# Patient Record
Sex: Male | Born: 1965 | Race: White | Hispanic: No | Smoking: Current every day smoker
Health system: Southern US, Community
[De-identification: ages and names within clinical notes are randomized; demographics above are authoritative.]

## PROBLEM LIST (undated history)

## (undated) DIAGNOSIS — IMO0002 Reserved for concepts with insufficient information to code with codable children: Secondary | ICD-10-CM

## (undated) DIAGNOSIS — J40 Bronchitis, not specified as acute or chronic: Secondary | ICD-10-CM

## (undated) DIAGNOSIS — M549 Dorsalgia, unspecified: Secondary | ICD-10-CM

---

## 2003-01-14 ENCOUNTER — Emergency Department (HOSPITAL_COMMUNITY): Admission: EM | Admit: 2003-01-14 | Discharge: 2003-01-14 | Payer: Self-pay | Admitting: Emergency Medicine

## 2003-01-14 ENCOUNTER — Encounter: Payer: Self-pay | Admitting: Emergency Medicine

## 2004-08-24 ENCOUNTER — Emergency Department (HOSPITAL_COMMUNITY): Admission: EM | Admit: 2004-08-24 | Discharge: 2004-08-24 | Payer: Self-pay | Admitting: *Deleted

## 2005-02-12 ENCOUNTER — Emergency Department (HOSPITAL_COMMUNITY): Admission: EM | Admit: 2005-02-12 | Discharge: 2005-02-12 | Payer: Self-pay | Admitting: Emergency Medicine

## 2005-03-24 ENCOUNTER — Emergency Department (HOSPITAL_COMMUNITY): Admission: EM | Admit: 2005-03-24 | Discharge: 2005-03-24 | Payer: Self-pay | Admitting: *Deleted

## 2006-01-02 ENCOUNTER — Emergency Department (HOSPITAL_COMMUNITY): Admission: EM | Admit: 2006-01-02 | Discharge: 2006-01-02 | Payer: Self-pay | Admitting: Emergency Medicine

## 2006-11-19 ENCOUNTER — Emergency Department (HOSPITAL_COMMUNITY): Admission: EM | Admit: 2006-11-19 | Discharge: 2006-11-19 | Payer: Self-pay | Admitting: Emergency Medicine

## 2007-10-26 ENCOUNTER — Emergency Department (HOSPITAL_COMMUNITY): Admission: EM | Admit: 2007-10-26 | Discharge: 2007-10-26 | Payer: Self-pay | Admitting: Emergency Medicine

## 2007-11-29 ENCOUNTER — Emergency Department (HOSPITAL_COMMUNITY): Admission: EM | Admit: 2007-11-29 | Discharge: 2007-11-29 | Payer: Self-pay | Admitting: Emergency Medicine

## 2008-05-22 ENCOUNTER — Emergency Department (HOSPITAL_COMMUNITY): Admission: EM | Admit: 2008-05-22 | Discharge: 2008-05-22 | Payer: Self-pay | Admitting: Emergency Medicine

## 2008-06-21 ENCOUNTER — Emergency Department (HOSPITAL_COMMUNITY): Admission: EM | Admit: 2008-06-21 | Discharge: 2008-06-21 | Payer: Self-pay | Admitting: Emergency Medicine

## 2008-07-04 ENCOUNTER — Emergency Department (HOSPITAL_COMMUNITY): Admission: EM | Admit: 2008-07-04 | Discharge: 2008-07-04 | Payer: Self-pay | Admitting: Emergency Medicine

## 2008-07-26 ENCOUNTER — Emergency Department (HOSPITAL_COMMUNITY): Admission: EM | Admit: 2008-07-26 | Discharge: 2008-07-26 | Payer: Self-pay | Admitting: Emergency Medicine

## 2008-08-20 ENCOUNTER — Emergency Department (HOSPITAL_COMMUNITY): Admission: EM | Admit: 2008-08-20 | Discharge: 2008-08-20 | Payer: Self-pay | Admitting: Emergency Medicine

## 2008-09-16 ENCOUNTER — Emergency Department (HOSPITAL_COMMUNITY): Admission: EM | Admit: 2008-09-16 | Discharge: 2008-09-16 | Payer: Self-pay | Admitting: Emergency Medicine

## 2008-10-21 ENCOUNTER — Encounter
Admission: RE | Admit: 2008-10-21 | Discharge: 2008-10-21 | Payer: Self-pay | Admitting: Physical Medicine & Rehabilitation

## 2009-04-08 ENCOUNTER — Emergency Department (HOSPITAL_COMMUNITY): Admission: EM | Admit: 2009-04-08 | Discharge: 2009-04-08 | Payer: Self-pay | Admitting: Emergency Medicine

## 2009-10-25 ENCOUNTER — Emergency Department: Payer: Self-pay | Admitting: Emergency Medicine

## 2009-11-05 ENCOUNTER — Emergency Department (HOSPITAL_COMMUNITY): Admission: EM | Admit: 2009-11-05 | Discharge: 2009-11-06 | Payer: Self-pay | Admitting: Emergency Medicine

## 2009-11-05 ENCOUNTER — Emergency Department: Payer: Self-pay | Admitting: Internal Medicine

## 2010-01-29 ENCOUNTER — Emergency Department (HOSPITAL_COMMUNITY): Admission: EM | Admit: 2010-01-29 | Discharge: 2010-01-29 | Payer: Self-pay | Admitting: Emergency Medicine

## 2010-01-30 ENCOUNTER — Emergency Department: Payer: Self-pay | Admitting: Emergency Medicine

## 2010-01-30 ENCOUNTER — Emergency Department: Payer: Self-pay | Admitting: Internal Medicine

## 2010-03-10 ENCOUNTER — Emergency Department: Payer: Self-pay | Admitting: Emergency Medicine

## 2010-03-15 ENCOUNTER — Emergency Department (HOSPITAL_COMMUNITY): Admission: EM | Admit: 2010-03-15 | Discharge: 2010-03-16 | Payer: Self-pay | Admitting: Emergency Medicine

## 2010-07-01 ENCOUNTER — Emergency Department (HOSPITAL_BASED_OUTPATIENT_CLINIC_OR_DEPARTMENT_OTHER)
Admission: EM | Admit: 2010-07-01 | Discharge: 2010-07-01 | Payer: Self-pay | Source: Home / Self Care | Admitting: Emergency Medicine

## 2010-09-05 ENCOUNTER — Emergency Department (HOSPITAL_COMMUNITY)
Admission: EM | Admit: 2010-09-05 | Discharge: 2010-09-06 | Disposition: A | Discharge status: Home / Self Care | Payer: Self-pay | Attending: Emergency Medicine | Admitting: Emergency Medicine

## 2010-09-05 DIAGNOSIS — K089 Disorder of teeth and supporting structures, unspecified: Secondary | ICD-10-CM | POA: Insufficient documentation

## 2010-09-05 DIAGNOSIS — K029 Dental caries, unspecified: Secondary | ICD-10-CM | POA: Insufficient documentation

## 2012-12-10 ENCOUNTER — Emergency Department (HOSPITAL_COMMUNITY): Payer: BC Managed Care – PPO

## 2012-12-10 ENCOUNTER — Emergency Department (HOSPITAL_COMMUNITY)
Admission: EM | Admit: 2012-12-10 | Discharge: 2012-12-10 | Disposition: A | Discharge status: Home / Self Care | Payer: BC Managed Care – PPO | Attending: Emergency Medicine | Admitting: Emergency Medicine

## 2012-12-10 ENCOUNTER — Encounter (HOSPITAL_COMMUNITY): Payer: Self-pay | Admitting: *Deleted

## 2012-12-10 DIAGNOSIS — Z79899 Other long term (current) drug therapy: Secondary | ICD-10-CM | POA: Insufficient documentation

## 2012-12-10 DIAGNOSIS — J45901 Unspecified asthma with (acute) exacerbation: Secondary | ICD-10-CM | POA: Insufficient documentation

## 2012-12-10 DIAGNOSIS — R059 Cough, unspecified: Secondary | ICD-10-CM | POA: Insufficient documentation

## 2012-12-10 DIAGNOSIS — F17200 Nicotine dependence, unspecified, uncomplicated: Secondary | ICD-10-CM | POA: Insufficient documentation

## 2012-12-10 DIAGNOSIS — J40 Bronchitis, not specified as acute or chronic: Secondary | ICD-10-CM

## 2012-12-10 DIAGNOSIS — J45909 Unspecified asthma, uncomplicated: Secondary | ICD-10-CM

## 2012-12-10 DIAGNOSIS — IMO0002 Reserved for concepts with insufficient information to code with codable children: Secondary | ICD-10-CM | POA: Insufficient documentation

## 2012-12-10 DIAGNOSIS — R05 Cough: Secondary | ICD-10-CM | POA: Insufficient documentation

## 2012-12-10 HISTORY — DX: Bronchitis, not specified as acute or chronic: J40

## 2012-12-10 MED ORDER — ALBUTEROL (5 MG/ML) CONTINUOUS INHALATION SOLN
10.0000 mg/h | INHALATION_SOLUTION | RESPIRATORY_TRACT | Status: DC
  Administered 2012-12-10: 10 mg/h via RESPIRATORY_TRACT

## 2012-12-10 MED ORDER — IPRATROPIUM BROMIDE 0.02 % IN SOLN
0.5000 mg | Freq: Once | RESPIRATORY_TRACT | Status: AC
  Administered 2012-12-10: 0.5 mg via RESPIRATORY_TRACT
  Filled 2012-12-10: qty 2.5

## 2012-12-10 MED ORDER — PREDNISONE 10 MG PO TABS: 60.0000 mg | ORAL_TABLET | Freq: Every day | ORAL | Status: DC

## 2012-12-10 MED ORDER — ALBUTEROL SULFATE (5 MG/ML) 0.5% IN NEBU
5.0000 mg | INHALATION_SOLUTION | Freq: Once | RESPIRATORY_TRACT | Status: AC
  Administered 2012-12-10: 5 mg via RESPIRATORY_TRACT
  Filled 2012-12-10: qty 0.5

## 2012-12-10 MED ORDER — ALBUTEROL SULFATE (5 MG/ML) 0.5% IN NEBU: 10.0000 mg | INHALATION_SOLUTION | Freq: Once | RESPIRATORY_TRACT | Status: DC

## 2012-12-10 MED ORDER — ALBUTEROL SULFATE HFA 108 (90 BASE) MCG/ACT IN AERS
2.0000 | INHALATION_SPRAY | RESPIRATORY_TRACT | Status: DC
  Administered 2012-12-10: 2 via RESPIRATORY_TRACT
  Filled 2012-12-10: qty 6.7

## 2012-12-10 MED ORDER — PREDNISONE 20 MG PO TABS
60.0000 mg | ORAL_TABLET | Freq: Once | ORAL | Status: AC
  Administered 2012-12-10: 60 mg via ORAL
  Filled 2012-12-10: qty 3

## 2012-12-10 MED ORDER — ALBUTEROL (5 MG/ML) CONTINUOUS INHALATION SOLN
INHALATION_SOLUTION | RESPIRATORY_TRACT | Status: AC
  Filled 2012-12-10: qty 20

## 2012-12-10 NOTE — ED Provider Notes (Signed)
History     CSN: [id number]  Arrival date & time 12/10/12  0059   First MD Initiated Contact with Patient 12/10/12 0123      Chief Complaint  Patient presents with  . Shortness of Breath    HPI Patient reports worsening coughing congestion as well as shortness breath for about 2 weeks.  He has tried albuterol at home without improvement in his symptoms.  He has no diagnosis of asthma or COPD/emphysema.  He reports he had similar symptoms about a year ago and was prescribed steroids albuterol that time with significant improvement in his symptoms.  He continues to smoke cigarettes.  His shins that he should stop.  No unilateral leg swelling.  No history of DVT or pulmonary embolism.  No fevers or chills.  He reports his cough is severe.  No chest pain.  His symptoms are mild to moderate in severity.    Past Medical History  Diagnosis Date  . Bronchitis     History reviewed. No pertinent past surgical history.  No family history on file.  History  Substance Use Topics  . Smoking status: Current Every Day Smoker  . Smokeless tobacco: Not on file  . Alcohol Use: Yes      Review of Systems  All other systems reviewed and are negative.    Allergies  Review of patient's allergies indicates no known allergies.  Home Medications   Current Outpatient Rx  Name  Route  Sig  Dispense  Refill  . albuterol (PROVENTIL HFA;VENTOLIN HFA) 108 (90 BASE) MCG/ACT inhaler   Inhalation   Inhale 2 puffs into the lungs every 6 (six) hours as needed for wheezing (SOB).         Marland Kitchen amoxicillin-clavulanate (AUGMENTIN) 875-125 MG per tablet   Oral   Take 1 tablet by mouth 2 (two) times daily. Pt took old  Pills  From last year         . predniSONE (DELTASONE) 10 MG tablet   Oral   Take 6 tablets (60 mg total) by mouth daily.   30 tablet   0     BP 138/84  Pulse 80  Temp(Src) 98.7 F (37.1 C) (Oral)  Resp 20  SpO2 97%  Physical Exam  Nursing note and vitals  reviewed. Constitutional: He is oriented to person, place, and time. He appears well-developed and well-nourished.  HENT:  Head: Normocephalic and atraumatic.  Eyes: EOM are normal.  Neck: Normal range of motion.  Cardiovascular: Normal rate, regular rhythm, normal heart sounds and intact distal pulses.   Pulmonary/Chest: Effort normal. No respiratory distress. He has wheezes.  Abdominal: Soft. He exhibits no distension. There is no tenderness.  Genitourinary: Rectum normal.  Musculoskeletal: Normal range of motion.  Neurological: He is alert and oriented to person, place, and time.  Skin: Skin is warm and dry.  Psychiatric: He has a normal mood and affect. Judgment normal.    ED Course  Procedures (including critical care time)  Labs Reviewed - No data to display Dg Chest 2 View  12/10/2012   *RADIOLOGY REPORT*  Clinical Data: Coughing short of breath  CHEST - 2 VIEW  Comparison: 04/08/2009  Findings: Normal mediastinum and cardiac silhouette.  Normal pulmonary  vasculature.  No evidence of effusion, infiltrate, or pneumothorax.  No acute bony abnormality.  IMPRESSION: Normal chest radiograph.   Original Report Authenticated By: Genevive Bi, M.D.   I personally reviewed the imaging tests through PACS system I reviewed available ER/hospitalization records through  the EMR   1. Bronchitis   2. Reactive airway disease       MDM  4:29 AM  patient feels much better at this time.  This is some type of reactive airway disease.  I've strongly encouraged patient to stop smoking cigarettes.  He will need to follow up with his primary care physician.  Home with albuterol and prednisone.  He understands to return to the ER for new or worsening symptoms.       3  Lyanne Co, MD 12/10/12 [phone]

## 2012-12-10 NOTE — ED Notes (Signed)
Chest congestion, cough and shob for about 2 weeks worse recently

## 2012-12-10 NOTE — ED Notes (Signed)
RT notified of need for extended nebulizer

## 2012-12-17 ENCOUNTER — Other Ambulatory Visit: Payer: Self-pay | Admitting: Occupational Medicine

## 2012-12-17 ENCOUNTER — Ambulatory Visit: Payer: Self-pay

## 2012-12-17 DIAGNOSIS — R52 Pain, unspecified: Secondary | ICD-10-CM

## 2013-02-01 ENCOUNTER — Emergency Department (HOSPITAL_COMMUNITY)
Admission: EM | Admit: 2013-02-01 | Discharge: 2013-02-02 | Disposition: A | Discharge status: Home / Self Care | Payer: Worker's Compensation | Attending: Emergency Medicine | Admitting: Emergency Medicine

## 2013-02-01 ENCOUNTER — Encounter (HOSPITAL_COMMUNITY): Payer: Self-pay | Admitting: Emergency Medicine

## 2013-02-01 DIAGNOSIS — Z8709 Personal history of other diseases of the respiratory system: Secondary | ICD-10-CM | POA: Insufficient documentation

## 2013-02-01 DIAGNOSIS — W010XXA Fall on same level from slipping, tripping and stumbling without subsequent striking against object, initial encounter: Secondary | ICD-10-CM | POA: Insufficient documentation

## 2013-02-01 DIAGNOSIS — F17200 Nicotine dependence, unspecified, uncomplicated: Secondary | ICD-10-CM | POA: Insufficient documentation

## 2013-02-01 DIAGNOSIS — Y9269 Other specified industrial and construction area as the place of occurrence of the external cause: Secondary | ICD-10-CM | POA: Insufficient documentation

## 2013-02-01 DIAGNOSIS — Z8739 Personal history of other diseases of the musculoskeletal system and connective tissue: Secondary | ICD-10-CM | POA: Insufficient documentation

## 2013-02-01 DIAGNOSIS — Z79899 Other long term (current) drug therapy: Secondary | ICD-10-CM | POA: Insufficient documentation

## 2013-02-01 DIAGNOSIS — S93409A Sprain of unspecified ligament of unspecified ankle, initial encounter: Secondary | ICD-10-CM | POA: Insufficient documentation

## 2013-02-01 DIAGNOSIS — Y9301 Activity, walking, marching and hiking: Secondary | ICD-10-CM | POA: Insufficient documentation

## 2013-02-01 DIAGNOSIS — Y990 Civilian activity done for income or pay: Secondary | ICD-10-CM | POA: Insufficient documentation

## 2013-02-01 HISTORY — DX: Dorsalgia, unspecified: M54.9

## 2013-02-01 NOTE — ED Notes (Signed)
C/o R ankle pain.  States he twisted R ankle while at work just pta. CMS intact.

## 2013-02-02 ENCOUNTER — Emergency Department (HOSPITAL_COMMUNITY): Payer: Worker's Compensation

## 2013-02-02 MED ORDER — HYDROCODONE-ACETAMINOPHEN 5-325 MG PO TABS
1.0000 | ORAL_TABLET | Freq: Once | ORAL | Status: AC
  Administered 2013-02-02: 1 via ORAL
  Filled 2013-02-02: qty 1

## 2013-02-02 MED ORDER — NAPROXEN 500 MG PO TABS: 500.0000 mg | ORAL_TABLET | Freq: Two times a day (BID) | ORAL | Status: DC

## 2013-02-02 NOTE — ED Provider Notes (Signed)
   History    CSN: [id number] Arrival date & time 02/01/13  2251  First MD Initiated Contact with Patient 02/01/13 2306     Chief Complaint  Patient presents with  . Ankle Pain   HPI  History provided by the patient. Patient is a 47 year old male who presents with complaints of right ankle injury and pain. 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 This caused him to stumble and twisted his right ankle. Since that time he has had pain to his ankle. Patient was wearing high ankle boot. He denies any other injuries. No head injury or LOC. He is not use any treatment for symptoms. Pain is worse with walking or pressure. No other aggravating or alleviating factors. No other associated symptoms.   Past Medical History  Diagnosis Date  . Bronchitis   . Back pain    History reviewed. No pertinent past surgical history. No family history on file. History  Substance Use Topics  . Smoking status: Current Every Day Smoker  . Smokeless tobacco: Not on file  . Alcohol Use: Yes    Review of Systems  Neurological: Negative for weakness and numbness.  All other systems reviewed and are negative.    Allergies  Review of patient's allergies indicates no known allergies.  Home Medications   Current Outpatient Rx  Name  Route  Sig  Dispense  Refill  . albuterol (PROVENTIL HFA;VENTOLIN HFA) 108 (90 BASE) MCG/ACT inhaler   Inhalation   Inhale 2 puffs into the lungs every 6 (six) hours as needed for wheezing (SOB).          BP 145/97  Pulse 95  Temp(Src) 97.9 F (36.6 C) (Oral)  Resp 16  SpO2 96% Physical Exam  Nursing note and vitals reviewed. Constitutional: He is oriented to person, place, and time. He appears well-developed and well-nourished.  HENT:  Head: Normocephalic.  Cardiovascular: Normal rate and regular rhythm.   Pulmonary/Chest: Effort normal and  breath sounds normal. No respiratory distress. He has no wheezes. He has no rales.  Musculoskeletal: He exhibits tenderness.  Moderate tenderness over the right lateral ankle. There is no significant swelling. No deformity. Skin normal in color. Normal dorsal pedal pulses, sensation in toes and Refill less than 2 seconds.  Neurological: He is alert and oriented to person, place, and time.  Skin: Skin is warm.  Psychiatric: He has a normal mood and affect. His behavior is normal.    ED Course  Procedures    Dg Ankle Complete Right  02/02/2013   *RADIOLOGY REPORT*  Clinical Data: Lateral ankle pain  RIGHT ANKLE - COMPLETE 3+ VIEW  Comparison: None.  Findings: Mortise is symmetric. No fracture or dislocation.  No soft tissue abnormality.  No radiopaque foreign body.  IMPRESSION: No acute osseous abnormality.   Original Report Authenticated By: Christiana Pellant, M.D.     1. Ankle sprain and strain, right, initial encounter     MDM  Patient seen and evaluated. Patient appears uncomfortable but in no acute distress.   X-rays unremarkable. At this time suspect minor sprain. Patient advised to use RICE treatment   Angus Seller, PA-C 02/02/13 0209

## 2013-02-02 NOTE — ED Provider Notes (Signed)
Medical screening examination/treatment/procedure(s) were performed by non-physician practitioner and as supervising physician I was immediately available for consultation/collaboration.   Dione Booze, MD 02/02/13 [phone]

## 2013-02-02 NOTE — ED Notes (Signed)
Paged ortho to apply aso ankle and provide crutches.

## 2013-02-02 NOTE — ED Notes (Signed)
Workman's Comp drug screen obtained by lab.

## 2013-05-22 ENCOUNTER — Emergency Department (HOSPITAL_COMMUNITY)
Admission: EM | Admit: 2013-05-22 | Discharge: 2013-05-22 | Disposition: A | Discharge status: Home / Self Care | Payer: BC Managed Care – PPO | Source: Home / Self Care | Attending: Family Medicine | Admitting: Family Medicine

## 2013-05-22 ENCOUNTER — Encounter (HOSPITAL_COMMUNITY): Payer: Self-pay | Admitting: Emergency Medicine

## 2013-05-22 DIAGNOSIS — M545 Low back pain, unspecified: Secondary | ICD-10-CM

## 2013-05-22 DIAGNOSIS — G8929 Other chronic pain: Secondary | ICD-10-CM

## 2013-05-22 NOTE — ED Provider Notes (Signed)
Medical screening examination/treatment/procedure(s) were performed by a resident physician and as supervising physician I was immediately available for consultation/collaboration.  Leslee Home, M.D.  Reuben Likes, MD 05/22/13 [phone]

## 2013-05-22 NOTE — ED Provider Notes (Signed)
CSN: [id number]     Arrival date & time 05/22/13  1650 History   First MD Initiated Contact with Patient 05/22/13 1827     Chief Complaint  Patient presents with  . Back Pain   (Consider location/radiation/quality/duration/timing/severity/associated sxs/prior Treatment) HPI Patient is a 47 yo M presenting for evaluation of chronic back pain. He had injury to back 12/11/12 while pulling heavy hose at work. He had a burning sensation down his back at that time. He has been diagnosed with DJD seen on MRI and X-rays. He states his Worker's Comp is not covering anything now. He is followed by Dr. Yevette Edwards and has been referred to another doctor and he is waiting on that appointment (?pain management). Patient states doctor is not giving him "anything strong", he is prescribed Norco once daily which he states does not help so he takes it more. He also is on a muscle relaxer. Recently prescribed Meloxicam and Tylenol #3.   When asked what he would like to accomplish tonight, he states he feels like he should be out of work. He states he is "fed up" with everything and is not going to get better if he does not rest. It was explained to him that at an [facility], we do acute care and not worker's comp evaluation. This should be done by a specialist. Also, we do not prescribe chronic medications such as pain pills. Any time off or medications may affect his case, but he states he does not care.  Past Medical History  Diagnosis Date  . Bronchitis   . Back pain    History reviewed. No pertinent past surgical history. History reviewed. No pertinent family history. History  Substance Use Topics  . Smoking status: Current Every Day Smoker  . Smokeless tobacco: Not on file  . Alcohol Use: Yes    Review of Systems  Constitutional: Negative for fever and chills.  HENT: Negative for congestion.   Eyes: Negative for visual disturbance.  Respiratory: Negative for cough and shortness of breath.    Cardiovascular: Negative for chest pain and leg swelling.  Gastrointestinal: Negative for abdominal pain.  Genitourinary: Negative for dysuria.  Musculoskeletal: Positive for arthralgias, back pain, gait problem and myalgias. Negative for neck pain.  Skin: Negative for rash.  Neurological: Negative for headaches.    Allergies  Review of patient's allergies indicates no known allergies.  Home Medications   Current Outpatient Rx  Name  Route  Sig  Dispense  Refill  . albuterol (PROVENTIL HFA;VENTOLIN HFA) 108 (90 BASE) MCG/ACT inhaler   Inhalation   Inhale 2 puffs into the lungs every 6 (six) hours as needed for wheezing (SOB).         . naproxen (NAPROSYN) 500 MG tablet   Oral   Take 1 tablet (500 mg total) by mouth 2 (two) times daily.   30 tablet   0    BP 142/94  Pulse 87  Temp(Src) 97.3 F (36.3 C) (Oral)  Resp 16  SpO2 99% Physical Exam  Constitutional: He is oriented to person, place, and time. He appears well-developed and well-nourished.  Seems upset  HENT:  Head: Normocephalic and atraumatic.  Neck: Normal range of motion. Neck supple.  Cardiovascular: Normal rate, regular rhythm and normal heart sounds.   Pulmonary/Chest: Effort normal and breath sounds normal. He has no wheezes.  Abdominal: Soft. There is no tenderness.  Musculoskeletal: He exhibits no edema and no tenderness.  Changes positions frequently in chair. Good ROM of  back, has unstrapped back brace on. No TTP of spinal processes. Neg straight leg raise on my exam. Able to ambulate.  Neurological: He is alert and oriented to person, place, and time.  Skin: Skin is warm and dry.  Psychiatric: He has a normal mood and affect.    ED Course  Procedures (including critical care time) Labs Review Labs Reviewed - No data to display Imaging Review No results found.  MDM   1. Chronic lower back pain    Explained to patient that these cases should be seen by specialist, especially since it is a  potential worker's comp.  No medications prescribed. Given work note for 11/6-11/7, and explained to patient that taking these days may interfere with his case. Follow up with ortho tomorrow, at least by phone.     Hilarie Fredrickson, MD 05/22/13 1900

## 2013-05-22 NOTE — ED Notes (Signed)
States he had injured his back 12-11-2012 [employer], and reportedly has had his workman's comp case denied. States his leg gave out at work today , and had not been able to get in touch w his orthopaedist about his situation today

## 2013-06-17 ENCOUNTER — Emergency Department (INDEPENDENT_AMBULATORY_CARE_PROVIDER_SITE_OTHER)
Admission: EM | Admit: 2013-06-17 | Discharge: 2013-06-17 | Disposition: A | Discharge status: Home / Self Care | Payer: BC Managed Care – PPO | Source: Home / Self Care | Attending: Family Medicine | Admitting: Family Medicine

## 2013-06-17 ENCOUNTER — Encounter (HOSPITAL_COMMUNITY): Payer: Self-pay | Admitting: Emergency Medicine

## 2013-06-17 DIAGNOSIS — M549 Dorsalgia, unspecified: Secondary | ICD-10-CM

## 2013-06-17 DIAGNOSIS — G8929 Other chronic pain: Secondary | ICD-10-CM

## 2013-06-17 NOTE — ED Provider Notes (Signed)
CSN: [id number]     Arrival date & time 06/17/13  1737 History   First MD Initiated Contact with Patient 06/17/13 1859     Chief Complaint  Patient presents with  . Back Pain   (Consider location/radiation/quality/duration/timing/severity/associated sxs/prior Treatment) Patient is a 47 y.o. male presenting with back pain. The history is provided by the patient.  Back Pain Location:  Lumbar spine Quality:  Stabbing and shooting Radiates to:  Does not radiate Pain severity:  Moderate Chronicity:  Chronic Context comment:  Chronic back pain made worse today by physical therapy today, according to patient, here for work note.and pain meds.   Past Medical History  Diagnosis Date  . Bronchitis   . Back pain    History reviewed. No pertinent past surgical history. History reviewed. No pertinent family history. History  Substance Use Topics  . Smoking status: Current Every Day Smoker  . Smokeless tobacco: Not on file  . Alcohol Use: Yes    Review of Systems  Constitutional: Negative.   Gastrointestinal: Negative.   Musculoskeletal: Positive for back pain and gait problem.  Skin: Negative.     Allergies  Review of patient's allergies indicates no known allergies.  Home Medications   Current Outpatient Rx  Name  Route  Sig  Dispense  Refill  . acetaminophen-codeine (TYLENOL #3) 300-30 MG per tablet   Oral   Take by mouth every 4 (four) hours as needed for moderate pain.         . cyclobenzaprine (FLEXERIL) 10 MG tablet   Oral   Take 10 mg by mouth 3 (three) times daily as needed for muscle spasms.         . Hydrocodone-Acetaminophen (NORCO PO)   Oral   Take by mouth.         Marland Kitchen albuterol (PROVENTIL HFA;VENTOLIN HFA) 108 (90 BASE) MCG/ACT inhaler   Inhalation   Inhale 2 puffs into the lungs every 6 (six) hours as needed for wheezing (SOB).         . naproxen (NAPROSYN) 500 MG tablet   Oral   Take 1 tablet (500 mg total) by mouth 2 (two) times daily.   30  tablet   0    BP 136/89  Pulse 105  Temp(Src) 97.9 F (36.6 C) (Oral)  Resp 20  SpO2 100% Physical Exam  Nursing note and vitals reviewed. Constitutional: He is oriented to person, place, and time. He appears well-developed and well-nourished. He appears distressed.  Abdominal: Soft. Bowel sounds are normal. There is no tenderness.  Musculoskeletal: He exhibits tenderness.       Lumbar back: He exhibits decreased range of motion, tenderness, bony tenderness, pain and spasm. He exhibits normal pulse.  Neurological: He is alert and oriented to person, place, and time.  Skin: Skin is warm and dry.    ED Course  Procedures (including critical care time) Labs Review Labs Reviewed - No data to display Imaging Review No results found.  EKG Interpretation    Date/Time:    Ventricular Rate:    PR Interval:    QRS Duration:   QT Interval:    QTC Calculation:   R Axis:     Text Interpretation:              MDM   1. Back pain, chronic       Linna Hoff, MD 06/17/13 [phone]

## 2013-06-17 NOTE — ED Notes (Signed)
Lower back pain, onset today.  Has tried norco and muscle relaxers per patient.

## 2013-06-19 ENCOUNTER — Encounter (HOSPITAL_COMMUNITY): Payer: Self-pay | Admitting: Emergency Medicine

## 2013-06-19 ENCOUNTER — Emergency Department (HOSPITAL_COMMUNITY)
Admission: EM | Admit: 2013-06-19 | Discharge: 2013-06-19 | Disposition: A | Discharge status: Home / Self Care | Payer: BC Managed Care – PPO | Attending: Emergency Medicine | Admitting: Emergency Medicine

## 2013-06-19 DIAGNOSIS — G8929 Other chronic pain: Secondary | ICD-10-CM | POA: Insufficient documentation

## 2013-06-19 DIAGNOSIS — Z79899 Other long term (current) drug therapy: Secondary | ICD-10-CM | POA: Insufficient documentation

## 2013-06-19 DIAGNOSIS — M545 Low back pain, unspecified: Secondary | ICD-10-CM | POA: Insufficient documentation

## 2013-06-19 DIAGNOSIS — Z87828 Personal history of other (healed) physical injury and trauma: Secondary | ICD-10-CM | POA: Insufficient documentation

## 2013-06-19 DIAGNOSIS — R52 Pain, unspecified: Secondary | ICD-10-CM | POA: Insufficient documentation

## 2013-06-19 DIAGNOSIS — F17200 Nicotine dependence, unspecified, uncomplicated: Secondary | ICD-10-CM | POA: Insufficient documentation

## 2013-06-19 DIAGNOSIS — IMO0002 Reserved for concepts with insufficient information to code with codable children: Secondary | ICD-10-CM | POA: Insufficient documentation

## 2013-06-19 DIAGNOSIS — Z791 Long term (current) use of non-steroidal anti-inflammatories (NSAID): Secondary | ICD-10-CM | POA: Insufficient documentation

## 2013-06-19 HISTORY — DX: Reserved for concepts with insufficient information to code with codable children: IMO0002

## 2013-06-19 MED ORDER — DIAZEPAM 5 MG PO TABS
5.0000 mg | ORAL_TABLET | Freq: Once | ORAL | Status: AC
  Administered 2013-06-19: 5 mg via ORAL
  Filled 2013-06-19: qty 1

## 2013-06-19 MED ORDER — PREDNISONE 20 MG PO TABS
60.0000 mg | ORAL_TABLET | Freq: Once | ORAL | Status: AC
  Administered 2013-06-19: 60 mg via ORAL
  Filled 2013-06-19: qty 3

## 2013-06-19 MED ORDER — HYDROCODONE-ACETAMINOPHEN 5-325 MG PO TABS: 1.0000 | ORAL_TABLET | Freq: Four times a day (QID) | ORAL | Status: AC | PRN

## 2013-06-19 MED ORDER — PREDNISONE 20 MG PO TABS: ORAL_TABLET | ORAL | Status: DC

## 2013-06-19 MED ORDER — HYDROMORPHONE HCL PF 1 MG/ML IJ SOLN
1.0000 mg | Freq: Once | INTRAMUSCULAR | Status: AC
  Administered 2013-06-19: 1 mg via INTRAMUSCULAR
  Filled 2013-06-19: qty 1

## 2013-06-19 MED ORDER — DIAZEPAM 5 MG PO TABS: 5.0000 mg | ORAL_TABLET | Freq: Three times a day (TID) | ORAL | Status: AC | PRN

## 2013-06-19 NOTE — ED Provider Notes (Signed)
Medical screening examination/treatment/procedure(s) were performed by non-physician practitioner and as supervising physician I was immediately available for consultation/collaboration.    Tyaire Odem M Shadee Rathod, MD 06/19/13 0547 

## 2013-06-19 NOTE — ED Notes (Signed)
Pt reports previous injury to back in May. Pt went to physical therapy yesterday and began having discomfort from PT. Pt states that he is having muscle spasms to L1 area. Pt dx with DDD. Pt states he is unable to work d/t pain.

## 2013-06-19 NOTE — ED Provider Notes (Signed)
CSN: [id number]     Arrival date & time 06/19/13  0022 History   First MD Initiated Contact with Patient 06/19/13 [phone]     Chief Complaint  Patient presents with  . Back Pain   (Consider location/radiation/quality/duration/timing/severity/associated sxs/prior Treatment) HPI Comments: Patient is status post occupational injury.  Has been discharged from their service to be followed by orthopedics, who have now referred him to pain management is awaiting that appointment.  He started physical therapy.  2 days ago.  Since that time.  He had increased pain, numbness, not controlled by his Tylenol threes, Mobic, and Skelaxin.  He was seen last night at urgent care was told that they could not write him a note for work.  He was stopped and his pain.  He went to work tonight, but was less than productive and is employed as told to come to the emergency department for further evaluation. He does have an appointment with his orthopedic surgeon, on Monday  Patient is a 47 y.o. male presenting with back pain. The history is provided by the patient.  Back Pain Location:  Lumbar spine Quality:  Aching Radiates to:  Does not radiate Pain severity:  Moderate Onset quality:  Gradual Duration:  3 days Timing:  Constant Progression:  Worsening Chronicity:  Recurrent Context comment:  After starting PT Relieved by:  Nothing Worsened by:  Movement Ineffective treatments:  Muscle relaxants, narcotics and NSAIDs Associated symptoms: no chest pain, no dysuria and no fever     Past Medical History  Diagnosis Date  . Bronchitis   . Back pain   . Degenerative disc disease    History reviewed. No pertinent past surgical history. No family history on file. History  Substance Use Topics  . Smoking status: Current Every Day Smoker  . Smokeless tobacco: Not on file  . Alcohol Use: Yes    Review of Systems  Constitutional: Negative for fever.  Respiratory: Negative for shortness of breath.     Cardiovascular: Negative for chest pain and leg swelling.  Genitourinary: Negative for dysuria, urgency and decreased urine volume.  Musculoskeletal: Positive for back pain.  Skin: Negative for rash and wound.  All other systems reviewed and are negative.    Allergies  Review of patient's allergies indicates no known allergies.  Home Medications   Current Outpatient Rx  Name  Route  Sig  Dispense  Refill  . acetaminophen-codeine (TYLENOL #3) 300-30 MG per tablet   Oral   Take by mouth every 4 (four) hours as needed for moderate pain.         . meloxicam (MOBIC) 15 MG tablet   Oral   Take 15 mg by mouth daily.         . metaxalone (SKELAXIN) 800 MG tablet   Oral   Take 800 mg by mouth 2 (two) times daily as needed for muscle spasms.         Marland Kitchen albuterol (PROVENTIL HFA;VENTOLIN HFA) 108 (90 BASE) MCG/ACT inhaler   Inhalation   Inhale 2 puffs into the lungs every 6 (six) hours as needed for wheezing (SOB).         Marland Kitchen diazepam (VALIUM) 5 MG tablet   Oral   Take 1 tablet (5 mg total) by mouth every 8 (eight) hours as needed for anxiety.   30 tablet   0   . HYDROcodone-acetaminophen (NORCO/VICODIN) 5-325 MG per tablet   Oral   Take 1-2 tablets by mouth every 6 (six) hours as needed.  30 tablet   0   . predniSONE (DELTASONE) 20 MG tablet      3 Tabs PO Days 1-3, then 2 tabs PO Days 4-6, then 1 tab PO Day 7-9, then Half Tab PO Day 10-12   20 tablet   0    BP 139/94  Pulse 77  Temp(Src) 97.8 F (36.6 C) (Oral)  Resp 18  SpO2 98% Physical Exam  Nursing note and vitals reviewed. Constitutional: He is oriented to person, place, and time. He appears well-developed and well-nourished.  HENT:  Head: Normocephalic.  Eyes: Pupils are equal, round, and reactive to light.  Neck: Normal range of motion.  Pulmonary/Chest: Effort normal.  Abdominal: Soft.  Musculoskeletal: Normal range of motion. He exhibits tenderness.       Back:  Neurological: He is alert and  oriented to person, place, and time.  Skin: Skin is warm and dry. No rash noted.    ED Course  Procedures (including critical care time) Labs Review Labs Reviewed - No data to display Imaging Review No results found.  EKG Interpretation   None       MDM   1. Acute exacerbation of chronic low back pain    To 30 4 AM.  Patient reassessed.  States his pain is now 8 or 9 to slightly improved.  The numbness and tingling have resolved    Arman Filter, NP 06/19/13 [phone]

## 2013-07-09 ENCOUNTER — Encounter (HOSPITAL_COMMUNITY): Payer: Self-pay | Admitting: Emergency Medicine

## 2013-07-09 ENCOUNTER — Emergency Department (HOSPITAL_COMMUNITY)
Admission: EM | Admit: 2013-07-09 | Discharge: 2013-07-10 | Disposition: A | Discharge status: Home / Self Care | Payer: BC Managed Care – PPO | Attending: Emergency Medicine | Admitting: Emergency Medicine

## 2013-07-09 DIAGNOSIS — Y990 Civilian activity done for income or pay: Secondary | ICD-10-CM | POA: Insufficient documentation

## 2013-07-09 DIAGNOSIS — F17200 Nicotine dependence, unspecified, uncomplicated: Secondary | ICD-10-CM | POA: Insufficient documentation

## 2013-07-09 DIAGNOSIS — Y9289 Other specified places as the place of occurrence of the external cause: Secondary | ICD-10-CM | POA: Insufficient documentation

## 2013-07-09 DIAGNOSIS — Y9389 Activity, other specified: Secondary | ICD-10-CM | POA: Insufficient documentation

## 2013-07-09 DIAGNOSIS — Z79899 Other long term (current) drug therapy: Secondary | ICD-10-CM | POA: Insufficient documentation

## 2013-07-09 DIAGNOSIS — X503XXA Overexertion from repetitive movements, initial encounter: Secondary | ICD-10-CM | POA: Insufficient documentation

## 2013-07-09 DIAGNOSIS — M549 Dorsalgia, unspecified: Secondary | ICD-10-CM

## 2013-07-09 DIAGNOSIS — Z8739 Personal history of other diseases of the musculoskeletal system and connective tissue: Secondary | ICD-10-CM | POA: Insufficient documentation

## 2013-07-09 DIAGNOSIS — IMO0002 Reserved for concepts with insufficient information to code with codable children: Secondary | ICD-10-CM | POA: Insufficient documentation

## 2013-07-09 DIAGNOSIS — Z8709 Personal history of other diseases of the respiratory system: Secondary | ICD-10-CM | POA: Insufficient documentation

## 2013-07-09 NOTE — ED Notes (Signed)
Patient is alert and oriented x3.  He is complaining of lower back pain with numbness and tingling in his lower extremities. Patient states that in may he hurt his back and has been on light duty at work.  Today he was working and bent over picking Up hoses and within 30 minutes the pain and numbness started.  Currently he rates his pain 10 of 10.

## 2013-07-10 MED ORDER — HYDROCODONE-ACETAMINOPHEN 5-325 MG PO TABS: 2.0000 | ORAL_TABLET | ORAL | Status: AC | PRN

## 2013-07-10 NOTE — ED Provider Notes (Signed)
CSN: [id number]     Arrival date & time 07/09/13  2331 History   First MD Initiated Contact with Patient 07/10/13 0103     Chief Complaint  Patient presents with  . Back Pain   (Consider location/radiation/quality/duration/timing/severity/associated sxs/prior Treatment) HPI Comments: 47 yo male with lower back pain hx, followed by ortho and recently referred to pain specialist presents with lower back pain after lifting hoses at work.  He currently has been followed from injury at work for similar. He is on light duty.  Pain with rom, similar to previous.  No fevers, weakness, b/b changes or IVDU.     Patient is a 47 y.o. male presenting with back pain. The history is provided by the patient.  Back Pain Location:  Lumbar spine Quality:  Aching Radiates to:  R posterior upper leg Pain severity:  Moderate Onset quality:  Gradual Progression:  Worsening Chronicity:  Recurrent Associated symptoms: numbness   Associated symptoms: no abdominal pain, no chest pain, no dysuria, no fever, no headaches and no weakness     Past Medical History  Diagnosis Date  . Bronchitis   . Back pain   . Degenerative disc disease    History reviewed. No pertinent past surgical history. History reviewed. No pertinent family history. History  Substance Use Topics  . Smoking status: Current Every Day Smoker  . Smokeless tobacco: Not on file  . Alcohol Use: Yes    Review of Systems  Constitutional: Negative for fever and chills.  Cardiovascular: Negative for chest pain.  Gastrointestinal: Negative for vomiting and abdominal pain.  Genitourinary: Negative for dysuria, flank pain and difficulty urinating.  Musculoskeletal: Positive for back pain. Negative for neck pain and neck stiffness.  Neurological: Positive for numbness. Negative for weakness, light-headedness and headaches.    Allergies  Review of patient's allergies indicates no known allergies.  Home Medications   Current Outpatient Rx   Name  Route  Sig  Dispense  Refill  . acetaminophen-codeine (TYLENOL #3) 300-30 MG per tablet   Oral   Take by mouth every 4 (four) hours as needed for moderate pain.         Marland Kitchen albuterol (PROVENTIL HFA;VENTOLIN HFA) 108 (90 BASE) MCG/ACT inhaler   Inhalation   Inhale 2 puffs into the lungs every 6 (six) hours as needed for wheezing (SOB).         Marland Kitchen diazepam (VALIUM) 5 MG tablet   Oral   Take 1 tablet (5 mg total) by mouth every 8 (eight) hours as needed for anxiety.   30 tablet   0   . HYDROcodone-acetaminophen (NORCO/VICODIN) 5-325 MG per tablet   Oral   Take 1-2 tablets by mouth every 6 (six) hours as needed.   30 tablet   0   . meloxicam (MOBIC) 15 MG tablet   Oral   Take 15 mg by mouth daily.         . metaxalone (SKELAXIN) 800 MG tablet   Oral   Take 800 mg by mouth 2 (two) times daily as needed for muscle spasms.         Marland Kitchen HYDROcodone-acetaminophen (NORCO) 5-325 MG per tablet   Oral   Take 2 tablets by mouth every 4 (four) hours as needed.   10 tablet   0    BP 128/78  Pulse 97  Temp(Src) 98.4 F (36.9 C) (Oral)  Resp 22  Ht 5\' 9"  (1.753 m)  Wt 216 lb 6 oz (98.147 kg)  BMI 31.94  kg/m2  SpO2 97% Physical Exam  Nursing note and vitals reviewed. Constitutional: He is oriented to person, place, and time. He appears well-developed and well-nourished.  HENT:  Head: Normocephalic and atraumatic.  Eyes: Conjunctivae are normal. Right eye exhibits no discharge. Left eye exhibits no discharge.  Neck: Normal range of motion. Neck supple. No tracheal deviation present.  Cardiovascular: Normal rate.   Pulmonary/Chest: Effort normal.  Abdominal: Soft. He exhibits no distension. There is no tenderness. There is no guarding.  Musculoskeletal: He exhibits tenderness. He exhibits no edema.  Lumbar paraspinal  Neurological: He is alert and oriented to person, place, and time.  Reflex Scores:      Patellar reflexes are 2+ on the right side and 2+ on the left  side.      Achilles reflexes are 2+ on the right side and 2+ on the left side. 5+ strength in UE and LE with f/e at major joints. Sensation to palpation intact in UE and LE. CNs 2-12 grossly intact. Neg straight leg   Skin: Skin is warm. No rash noted.  Psychiatric: He has a normal mood and affect.    ED Course  Procedures (including critical care time) Labs Review Labs Reviewed - No data to display Imaging Review No results found.  EKG Interpretation   None       MDM   1. Back pain, acute    Acute on chronic. Overall similar to previous. Discussed importance of fup outpt. Few po pain pills to cover the Holidays. Normal neuro.  Results and differential diagnosis were discussed with the patient. Close follow up outpatient was discussed, patient comfortable with the plan.   Diagnosis: above    Enid Skeens, MD 07/10/13 [phone]

## 2014-01-17 IMAGING — CR DG ANKLE COMPLETE 3+V*R*
3 series · 3 of 3 positions shown · non-contrast
Comparison: None.

CLINICAL DATA: Lateral ankle pain

RIGHT ANKLE - COMPLETE 3+ VIEW

[t ankle joint ap right]
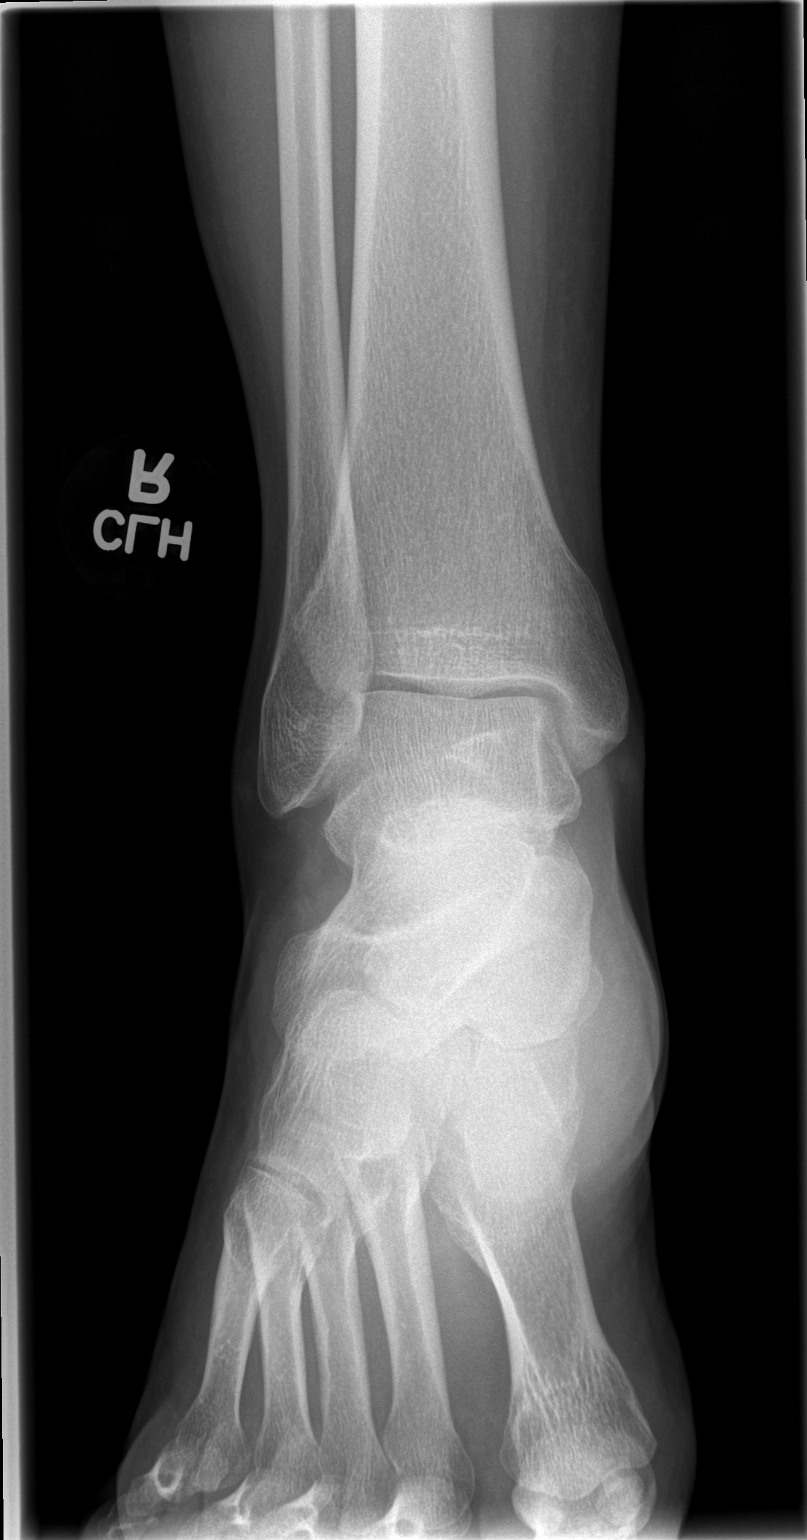

[t ankle joint oblique right]
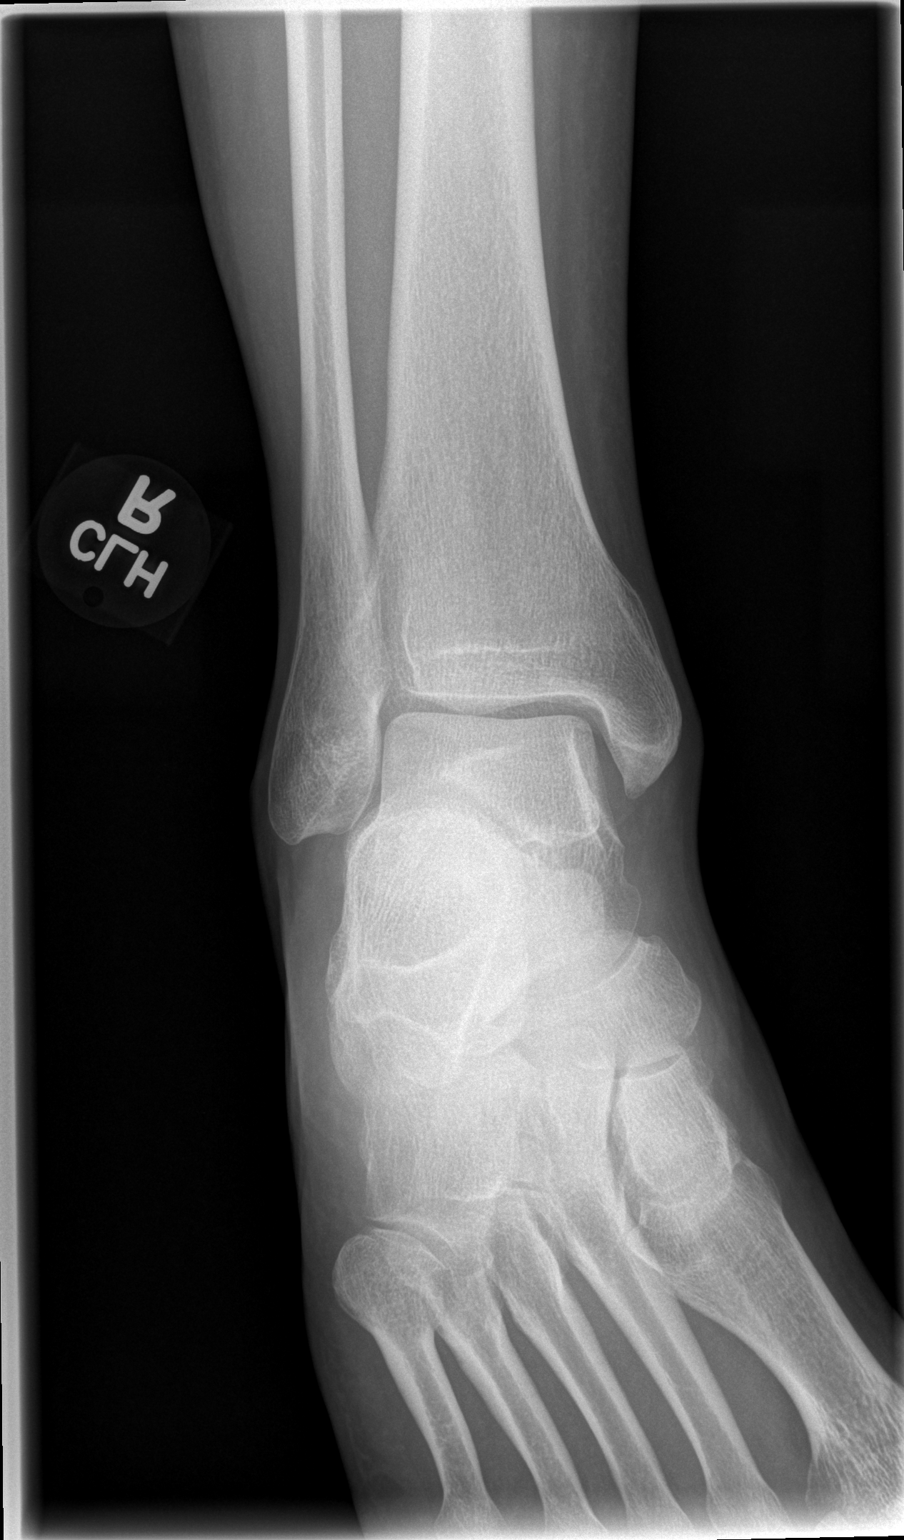

[t ankle joint lat right]
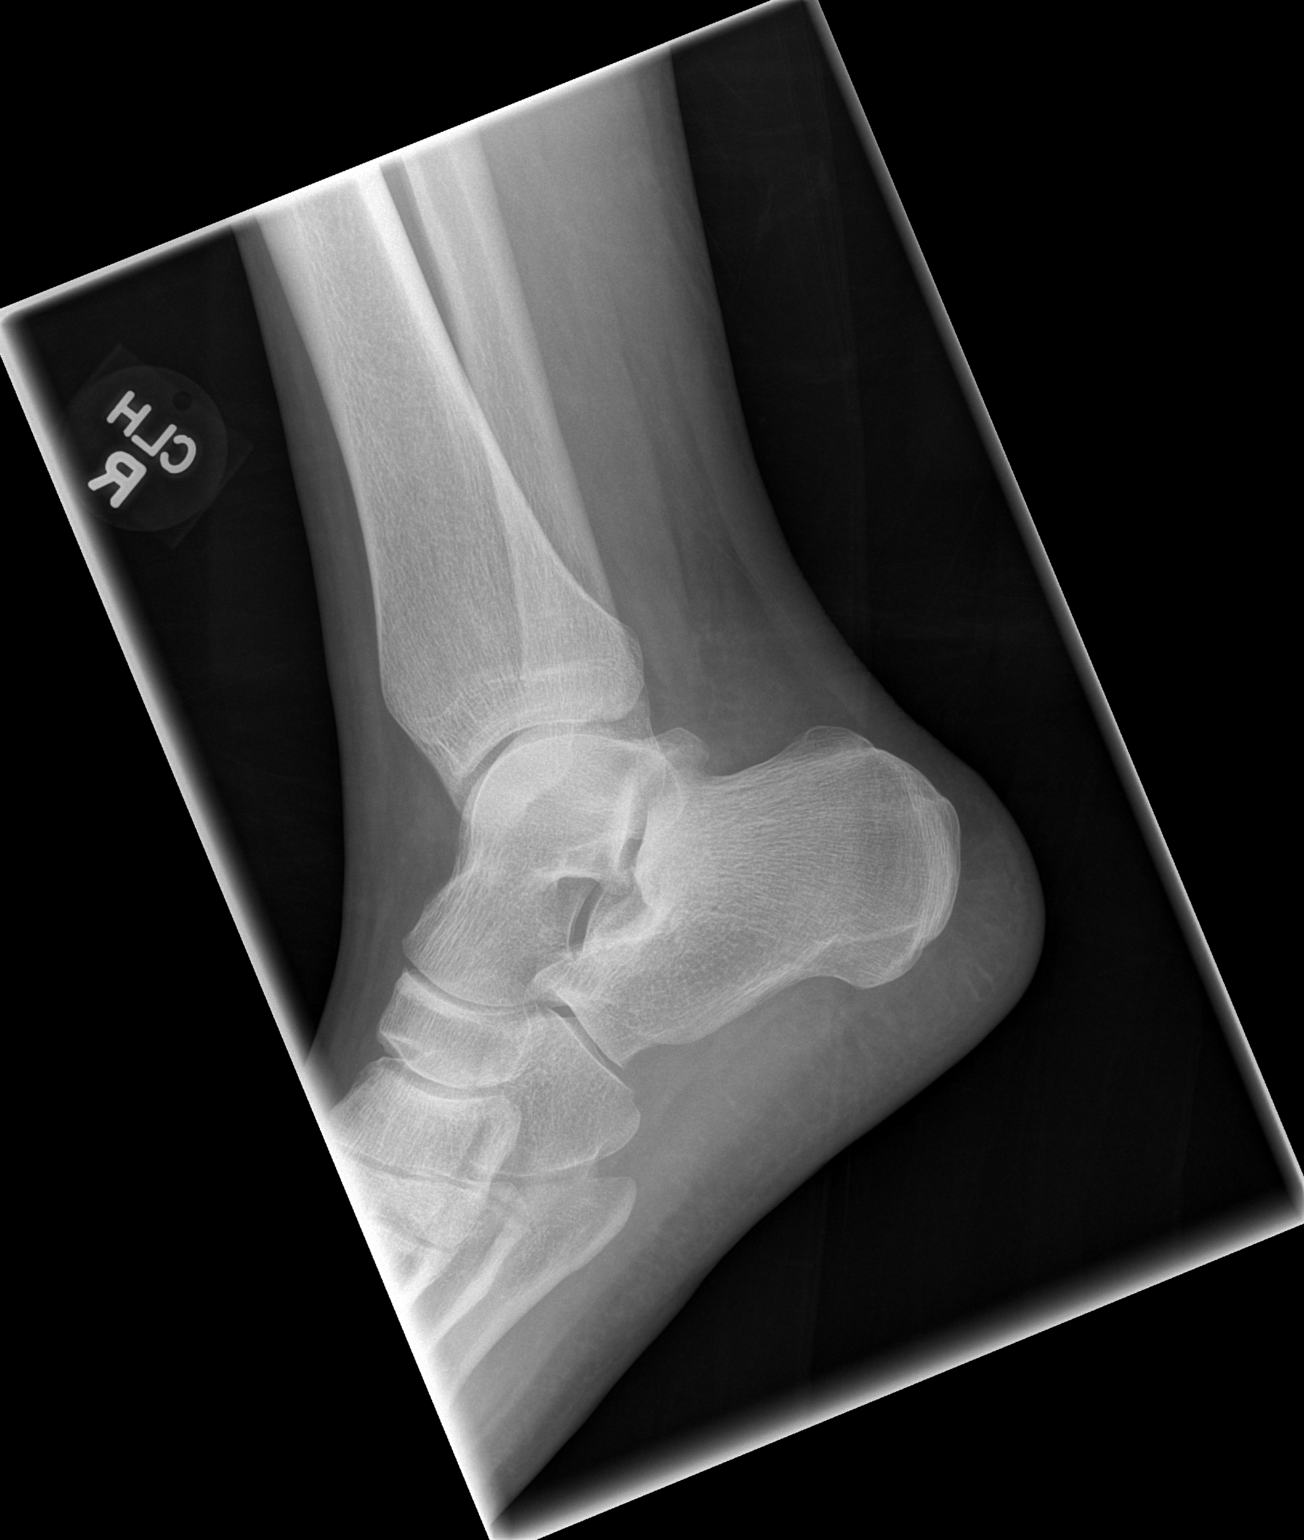

[3 of 3 positions shown; findings below may reference images not displayed]

FINDINGS: Mortise is symmetric. No fracture or dislocation.  No
soft tissue abnormality.  No radiopaque foreign body.
IMPRESSION: No acute osseous abnormality.

## 2014-03-05 ENCOUNTER — Telehealth: Payer: Self-pay

## 2014-03-05 NOTE — Telephone Encounter (Signed)
Close encounter
